# Patient Record
Sex: Female | Born: 1985 | Race: Black or African American | Hispanic: No | Marital: Single | Smoking: Current every day smoker
Health system: Southern US, Community
[De-identification: ages and names within clinical notes are randomized; demographics above are authoritative.]

## PROBLEM LIST (undated history)

## (undated) HISTORY — PX: BREAST REDUCTION SURGERY: SHX8

---

## 2014-01-25 ENCOUNTER — Encounter (HOSPITAL_COMMUNITY): Payer: Self-pay | Admitting: Emergency Medicine

## 2014-01-25 ENCOUNTER — Emergency Department (HOSPITAL_COMMUNITY)
Admission: EM | Admit: 2014-01-25 | Discharge: 2014-01-26 | Disposition: A | Discharge status: Home / Self Care | Payer: Non-veteran care | Attending: Emergency Medicine | Admitting: Emergency Medicine

## 2014-01-25 DIAGNOSIS — F17200 Nicotine dependence, unspecified, uncomplicated: Secondary | ICD-10-CM | POA: Insufficient documentation

## 2014-01-25 DIAGNOSIS — J329 Chronic sinusitis, unspecified: Secondary | ICD-10-CM | POA: Insufficient documentation

## 2014-01-25 MED ORDER — PSEUDOEPHEDRINE HCL 60 MG PO TABS: 60.0000 mg | ORAL_TABLET | ORAL | Status: AC | PRN

## 2014-01-25 NOTE — ED Provider Notes (Signed)
CSN: [id number]     Arrival date & time 01/25/14  2223 History   This chart was scribed for non-physician practitioner, Junious Silk, PA-C, working with Dione Booze, MD by Smiley Houseman, ED Scribe. This patient was seen in room TR07C/TR07C and the patient's care was started at 11:33 PM.  No chief complaint on file.  The history is provided by the patient. No language interpreter was used.   HPI Comments: Lori Dunn is a 28 y.o. female who presents to the Emergency Department complaining of a constant worsening sore throat that started 4 days ago.  Pt has associated HA, chest congestion, and non productive cough.  Pt denies fever, chills, abdominal pain, nausea, and emesis.  Pt denies taking any OTC medications.  Pt states she is otherwise healthy.    History reviewed. No pertinent past medical history. History reviewed. No pertinent past surgical history. No family history on file. History  Substance Use Topics  . Smoking status: Current Every Day Smoker  . Smokeless tobacco: Not on file  . Alcohol Use: Yes     Comment: occ   OB History   Grav Para Term Preterm Abortions TAB SAB Ect Mult Living                 Review of Systems  Constitutional: Negative for fever and chills.  HENT: Positive for congestion and sore throat. Negative for ear pain and rhinorrhea.   Respiratory: Positive for cough. Negative for shortness of breath.   Cardiovascular: Negative for chest pain.  Gastrointestinal: Negative for nausea, vomiting, abdominal pain and diarrhea.  Musculoskeletal: Negative for back pain and myalgias.  Skin: Negative for color change and rash.  Neurological: Positive for headaches. Negative for syncope.  All other systems reviewed and are negative.   Allergies  Ibuprofen and Other  Home Medications   Triage Vitals: BP 124/61  Pulse 103  Temp(Src) 98.2 F (36.8 C) (Oral)  Resp 18  Wt 165 lb 8 oz (75.07 kg)  SpO2 99%  LMP 01/21/2014  Physical Exam  Nursing note  and vitals reviewed. Constitutional: She is oriented to person, place, and time. She appears well-developed and well-nourished. No distress.  HENT:  Head: Normocephalic and atraumatic.  Right Ear: Tympanic membrane, external ear and ear canal normal.  Left Ear: Tympanic membrane, external ear and ear canal normal.  Nose: Right sinus exhibits maxillary sinus tenderness and frontal sinus tenderness. Left sinus exhibits maxillary sinus tenderness and frontal sinus tenderness.  Mouth/Throat: Uvula is midline and mucous membranes are normal. No trismus in the jaw. No uvula swelling. Posterior oropharyngeal erythema (mild) present. No oropharyngeal exudate, posterior oropharyngeal edema or tonsillar abscesses.  Eyes: Conjunctivae are normal.  Neck: Normal range of motion. No thyromegaly present.  Cardiovascular: Normal rate, regular rhythm and normal heart sounds.   Pulmonary/Chest: Effort normal and breath sounds normal. No stridor. No respiratory distress. She has no wheezes. She has no rales.  Abdominal: Soft. She exhibits no distension.  Musculoskeletal: Normal range of motion.  Lymphadenopathy:    She has no cervical adenopathy.  Neurological: She is alert and oriented to person, place, and time. She has normal strength.  Skin: Skin is warm and dry. No rash noted. She is not diaphoretic. No erythema.  Psychiatric: She has a normal mood and affect. Her behavior is normal. Judgment and thought content normal.    ED Course  Procedures (including critical care time) DIAGNOSTIC STUDIES: Oxygen Saturation is 99% on RA, normal by my interpretation.  COORDINATION OF CARE: 11:38 PM-Will order rapid strep screen.  Patient informed of current plan of treatment and evaluation and agrees with plan.    Results for orders placed during the hospital encounter of 01/25/14  RAPID STREP SCREEN      Result Value Ref Range   Streptococcus, Group A Screen (Direct) NEGATIVE  NEGATIVE    Imaging  Review No results found.   EKG Interpretation None      MDM   Final diagnoses:  Sinusitis   Patient complaining of symptoms of sinusitis. Mild to moderate symptoms of clear/yellow nasal discharge/congestion and scratchy throat with cough for less than 10 days.  Patient is afebrile.  No concern for acute bacterial rhinosinusitis; likely viral in nature.  Patient discharged with symptomatic treatment. Recommendations for follow-up with primary care physician.    I personally performed the services described in this documentation, which was scribed in my presence. The recorded information has been reviewed and is accurate.      Mora BellmanHannah S Lajuan Godbee, PA-C 01/26/14 [phone]

## 2014-01-25 NOTE — Discharge Instructions (Signed)

## 2014-01-25 NOTE — ED Notes (Signed)
PT sore throat, headache since Saturday.  Throat red and irritated.  Pt sounds congested, sinus area sore

## 2014-01-25 NOTE — ED Notes (Signed)
Pt reports sore throat since Saturday

## 2014-01-26 LAB — RAPID STREP SCREEN (MED CTR MEBANE ONLY): Streptococcus, Group A Screen (Direct): NEGATIVE

## 2014-01-27 NOTE — ED Provider Notes (Signed)
Medical screening examination/treatment/procedure(s) were performed by non-physician practitioner and as supervising physician I was immediately available for consultation/collaboration.   Dione Boozeavid Sorrel Cassetta, MD 01/27/14 [phone]

## 2014-01-28 LAB — CULTURE, GROUP A STREP

## 2016-09-09 ENCOUNTER — Emergency Department (HOSPITAL_COMMUNITY)
Admission: EM | Admit: 2016-09-09 | Discharge: 2016-09-09 | Disposition: A | Discharge status: Home / Self Care | Payer: Non-veteran care | Attending: Emergency Medicine | Admitting: Emergency Medicine

## 2016-09-09 ENCOUNTER — Emergency Department (HOSPITAL_COMMUNITY): Payer: Non-veteran care

## 2016-09-09 ENCOUNTER — Encounter (HOSPITAL_COMMUNITY): Payer: Self-pay

## 2016-09-09 DIAGNOSIS — Y939 Activity, unspecified: Secondary | ICD-10-CM | POA: Diagnosis not present

## 2016-09-09 DIAGNOSIS — Y999 Unspecified external cause status: Secondary | ICD-10-CM | POA: Insufficient documentation

## 2016-09-09 DIAGNOSIS — F17210 Nicotine dependence, cigarettes, uncomplicated: Secondary | ICD-10-CM | POA: Diagnosis not present

## 2016-09-09 DIAGNOSIS — M25552 Pain in left hip: Secondary | ICD-10-CM | POA: Insufficient documentation

## 2016-09-09 DIAGNOSIS — S161XXA Strain of muscle, fascia and tendon at neck level, initial encounter: Secondary | ICD-10-CM | POA: Diagnosis not present

## 2016-09-09 DIAGNOSIS — S0990XA Unspecified injury of head, initial encounter: Secondary | ICD-10-CM | POA: Insufficient documentation

## 2016-09-09 DIAGNOSIS — Y92410 Unspecified street and highway as the place of occurrence of the external cause: Secondary | ICD-10-CM | POA: Diagnosis not present

## 2016-09-09 LAB — I-STAT BETA HCG BLOOD, ED (MC, WL, AP ONLY)

## 2016-09-09 MED ORDER — HYDROCODONE-ACETAMINOPHEN 5-325 MG PO TABS: 1.0000 | ORAL_TABLET | Freq: Four times a day (QID) | ORAL | 0 refills | Status: AC | PRN

## 2016-09-09 MED ORDER — CYCLOBENZAPRINE HCL 10 MG PO TABS: 10.0000 mg | ORAL_TABLET | Freq: Two times a day (BID) | ORAL | 0 refills | Status: AC | PRN

## 2016-09-09 MED ORDER — HYDROCODONE-ACETAMINOPHEN 5-325 MG PO TABS
2.0000 | ORAL_TABLET | Freq: Once | ORAL | Status: AC
  Administered 2016-09-09: 2 via ORAL
  Filled 2016-09-09: qty 2
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
## 2016-09-09 NOTE — ED Notes (Signed)
Pt to be moved to adult side to be seen.
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
## 2017-10-27 ENCOUNTER — Emergency Department (HOSPITAL_COMMUNITY)
Admission: EM | Admit: 2017-10-27 | Discharge: 2017-10-27 | Disposition: A | Discharge status: Home / Self Care | Payer: Non-veteran care | Attending: Emergency Medicine | Admitting: Emergency Medicine

## 2017-10-27 ENCOUNTER — Encounter (HOSPITAL_COMMUNITY): Payer: Self-pay

## 2017-10-27 ENCOUNTER — Other Ambulatory Visit: Payer: Self-pay

## 2017-10-27 ENCOUNTER — Emergency Department (HOSPITAL_COMMUNITY): Payer: Non-veteran care

## 2017-10-27 DIAGNOSIS — J189 Pneumonia, unspecified organism: Secondary | ICD-10-CM

## 2017-10-27 DIAGNOSIS — R072 Precordial pain: Secondary | ICD-10-CM | POA: Diagnosis present

## 2017-10-27 DIAGNOSIS — F17210 Nicotine dependence, cigarettes, uncomplicated: Secondary | ICD-10-CM | POA: Diagnosis not present

## 2017-10-27 DIAGNOSIS — Z720 Tobacco use: Secondary | ICD-10-CM

## 2017-10-27 DIAGNOSIS — J181 Lobar pneumonia, unspecified organism: Secondary | ICD-10-CM | POA: Diagnosis not present

## 2017-10-27 LAB — BASIC METABOLIC PANEL
Anion gap: 11 (ref 5–15)
BUN: 8 mg/dL (ref 6–20)
CALCIUM: 9 mg/dL (ref 8.9–10.3)
CO2: 19 mmol/L — ABNORMAL LOW (ref 22–32)
Chloride: 103 mmol/L (ref 101–111)
Creatinine, Ser: 0.97 mg/dL (ref 0.44–1.00)
GFR calc Af Amer: >60 mL/min (ref >60)
GFR calc non Af Amer: >60 mL/min (ref >60)
Glucose, Bld: 111 mg/dL — ABNORMAL HIGH (ref 65–99)
POTASSIUM: 3.5 mmol/L (ref 3.5–5.1)
SODIUM: 133 mmol/L — AB (ref 135–145)

## 2017-10-27 LAB — CBC
HCT: 42.6 % (ref 36.0–46.0)
Hemoglobin: 13.7 g/dL (ref 12.0–15.0)
MCH: 29.8 pg (ref 26.0–34.0)
MCHC: 32.2 g/dL (ref 30.0–36.0)
MCV: 92.6 fL (ref 78.0–100.0)
PLATELETS: 397 10*3/uL (ref 150–400)
RBC: 4.6 MIL/uL (ref 3.87–5.11)
RDW: 13.8 % (ref 11.5–15.5)
WBC: 19 10*3/uL — AB (ref 4.0–10.5)

## 2017-10-27 LAB — I-STAT TROPONIN, ED
TROPONIN I, POC: 0.03 ng/mL (ref 0.00–0.08)
Troponin i, poc: 0 ng/mL (ref 0.00–0.08)

## 2017-10-27 LAB — I-STAT BETA HCG BLOOD, ED (MC, WL, AP ONLY): I-stat hCG, quantitative: 27.7 m[IU]/mL — ABNORMAL HIGH (ref <5)

## 2017-10-27 MED ORDER — GI COCKTAIL ~~LOC~~
30.0000 mL | Freq: Once | ORAL | Status: AC
  Administered 2017-10-27: 30 mL via ORAL
  Filled 2017-10-27: qty 30

## 2017-10-27 MED ORDER — ASPIRIN 81 MG PO CHEW
324.0000 mg | CHEWABLE_TABLET | Freq: Once | ORAL | Status: AC
Start: 2017-10-27 — End: 2017-10-27
  Administered 2017-10-27: 324 mg via ORAL
  Filled 2017-10-27: qty 4

## 2017-10-27 MED ORDER — ALBUTEROL SULFATE (2.5 MG/3ML) 0.083% IN NEBU
5.0000 mg | INHALATION_SOLUTION | Freq: Once | RESPIRATORY_TRACT | Status: AC
  Administered 2017-10-27: 5 mg via RESPIRATORY_TRACT
  Filled 2017-10-27: qty 6

## 2017-10-27 MED ORDER — IPRATROPIUM BROMIDE 0.02 % IN SOLN
0.5000 mg | Freq: Once | RESPIRATORY_TRACT | Status: AC
Start: 2017-10-27 — End: 2017-10-27
  Administered 2017-10-27: 0.5 mg via RESPIRATORY_TRACT
  Filled 2017-10-27: qty 2.5

## 2017-10-27 MED ORDER — MORPHINE SULFATE (PF) 4 MG/ML IV SOLN
4.0000 mg | Freq: Once | INTRAVENOUS | Status: AC
Start: 2017-10-27 — End: 2017-10-27
  Administered 2017-10-27: 4 mg via INTRAVENOUS
  Filled 2017-10-27: qty 1

## 2017-10-27 MED ORDER — ALBUTEROL SULFATE HFA 108 (90 BASE) MCG/ACT IN AERS
2.0000 | INHALATION_SPRAY | Freq: Once | RESPIRATORY_TRACT | Status: AC
  Administered 2017-10-27: 2 via RESPIRATORY_TRACT
  Filled 2017-10-27: qty 6.7

## 2017-10-27 MED ORDER — IPRATROPIUM BROMIDE 0.02 % IN SOLN
0.5000 mg | Freq: Once | RESPIRATORY_TRACT | Status: AC
  Administered 2017-10-27: 0.5 mg via RESPIRATORY_TRACT
  Filled 2017-10-27: qty 2.5

## 2017-10-27 MED ORDER — ALBUTEROL SULFATE HFA 108 (90 BASE) MCG/ACT IN AERS: 1.0000 | INHALATION_SPRAY | RESPIRATORY_TRACT | 0 refills | Status: AC | PRN

## 2017-10-27 MED ORDER — DOXYCYCLINE HYCLATE 100 MG PO CAPS: 100.0000 mg | ORAL_CAPSULE | Freq: Two times a day (BID) | ORAL | 0 refills | Status: AC

## 2017-10-27 NOTE — Discharge Instructions (Signed)
Your work up today has revealed that you have pneumonia in your left lower lobe, take antibiotics as directed until completed. Continue to stay well-hydrated. Continue to alternate between Tylenol and Ibuprofen for pain or fever. Use Mucinex for cough suppression/expectoration of mucus. May consider over-the-counter Benadryl or other antihistamine to decrease secretions and for help with your symptoms. Use inhaler as directed, as needed for cough/chest congestion/wheezing/shortness of breath. STOP SMOKING! Follow up with your primary care doctor in 5-7 days for recheck of ongoing symptoms. Return to emergency department for emergent changing or worsening of symptoms.

## 2017-10-27 NOTE — ED Provider Notes (Signed)
[facility] Provider Note   CSN: [id number] Arrival date & time: 10/27/17  1024     History   Chief Complaint Chief Complaint  Patient presents with  . Chest Pain    HPI Lori Dunn is a 32 y.o. otherwise healthy female, who presents to the ED with complaints of gradual onset chest pain that began around 8:30 PM while at rest yesterday.  She describes the pain as 9/10 constant stabbing central chest pain radiating into her left neck and shoulder, worse with breathing, and unrelieved with 2 baby aspirins.  She states that she was sweating all night, had associated lightheadedness, tingling in her left arm, and slept in her chair because laying flat made the pain worse however she denies gasping for breath or having difficulty breathing if laying flat.  She admits to being a cigarette smoker, but denies having any history of COPD or asthma.  No known family history of cardiac disease.  She has never had a stress test or an echo, however she is supposed to have a stress test through the [facility].  [place] P[place] at the [facility] in [place].  Of note, LMP was 10/17/17 until several days ago, and she is not sexually active with males, denies any possibility of pregnancy.  She denies fevers, chills, cough, URI symptoms, SOB, LE swelling, recent travel/surgery/immobilization, estrogen use, personal/family hx of DVT/PE, abd pain, N/V/D/C, hematuria, dysuria, myalgias, arthralgias, claudication, unexpected weight change, numbness, focal weakness, or any other complaints at this time.    The history is provided by the patient and medical records. No language interpreter was used.  Chest Pain   This is a new problem. The current episode started 12 to 24 hours ago. The problem occurs constantly. The problem has not changed since onset.The pain is associated with rest. The pain is present in the substernal region. The pain is at a severity of 9/10. The pain is moderate. The  quality of the pain is described as stabbing. The pain radiates to the left neck and left shoulder. Duration of episode(s) is 15 hours. The symptoms are aggravated by deep breathing. Associated symptoms include diaphoresis (sweat all last night) and orthopnea (not gasping for air, but pain worsened if laying flat). Pertinent negatives include no abdominal pain, no claudication, no cough, no fever, no lower extremity edema, no nausea, no numbness, no shortness of breath, no vomiting and no weakness. Treatments tried: 2 ASA 81mg . The treatment provided no relief. Risk factors include smoking/tobacco exposure.  Pertinent negatives for past medical history include no COPD, no DVT and no PE.  Pertinent negatives for family medical history include: no CAD, no early MI and no PE.    History reviewed. No pertinent past medical history.  There are no active problems to display for this patient.   Past Surgical History:  Procedure Laterality Date  . BREAST REDUCTION SURGERY      OB History    No data available       Home Medications    Prior to Admission medications   Medication Sig Start Date End Date Taking? Authorizing Provider  cyclobenzaprine (FLEXERIL) 10 MG tablet Take 1 tablet (10 mg total) by mouth 2 (two) times daily as needed for muscle spasms. 09/09/16   Roxy HorsemanBrowning, Robert, PA-C  HYDROcodone-acetaminophen (NORCO/VICODIN) 5-325 MG tablet Take 1-2 tablets by mouth every 6 (six) hours as needed. 09/09/16   Roxy HorsemanBrowning, Robert, PA-C  pseudoephedrine (SUDAFED) 60 MG tablet Take 1 tablet (60 mg  total) by mouth every 4 (four) hours as needed for congestion. Patient not taking: Reported on 09/09/2016 01/25/14   Junious Silk, PA-C    Family History No family history on file.  Social History Social History   Tobacco Use  . Smoking status: Current Every Day Smoker    Packs/day: 0.50    Types: Cigarettes  . Smokeless tobacco: Never Used  Substance Use Topics  . Alcohol use: Yes     Comment: occ  . Drug use: No     Allergies   Ibuprofen and Other   Review of Systems Review of Systems  Constitutional: Positive for diaphoresis (sweat all last night). Negative for chills, fever and unexpected weight change.  HENT: Negative for congestion and rhinorrhea.   Respiratory: Negative for cough and shortness of breath.   Cardiovascular: Positive for chest pain and orthopnea (not gasping for air, but pain worsened if laying flat). Negative for claudication and leg swelling.  Gastrointestinal: Negative for abdominal pain, constipation, diarrhea, nausea and vomiting.  Genitourinary: Negative for dysuria and hematuria.  Musculoskeletal: Negative for arthralgias and myalgias.  Skin: Negative for color change.  Allergic/Immunologic: Negative for immunocompromised state.  Neurological: Positive for light-headedness. Negative for weakness and numbness.       +tingling L arm  Psychiatric/Behavioral: Negative for confusion.   All other systems reviewed and are negative for acute change except as noted in the HPI.    Physical Exam Updated Vital Signs BP (!) 137/58 (BP Location: Right Arm)   Pulse 98   Temp 98.2 F (36.8 C) (Oral)   Resp 20   Ht 5\' 4"  (1.626 m)   Wt 81.6 kg (180 lb)   LMP 10/19/2017   SpO2 99%   BMI 30.90 kg/m   Physical Exam  Constitutional: She is oriented to person, place, and time. Vital signs are normal. She appears well-developed and well-nourished.  Non-toxic appearance. No distress.  Afebrile, nontoxic, NAD  HENT:  Head: Normocephalic and atraumatic.  Mouth/Throat: Oropharynx is clear and moist and mucous membranes are normal.  Eyes: Conjunctivae and EOM are normal. Right eye exhibits no discharge. Left eye exhibits no discharge.  Neck: Normal range of motion. Neck supple.  Cardiovascular: Normal rate, regular rhythm, normal heart sounds and intact distal pulses. Exam reveals no gallop and no friction rub.  No murmur heard. RRR, nl s1/s2, no  m/r/g, distal pulses intact, no pedal edema   Pulmonary/Chest: Effort normal. No respiratory distress. She has decreased breath sounds in the left lower field. She has no wheezes. She has rhonchi in the left lower field. She has no rales. She exhibits tenderness. She exhibits no crepitus, no deformity and no retraction.  Mildly diminished lung sounds in LLL with slight mild rhonchi in that area, otherwise remainder of lungs CTAB in all other lung fields, no wheezing/rales, no hypoxia or increased WOB, speaking in full sentences, SpO2 99% on RA Chest wall with mild TTP over anterior sternum, without crepitus, deformities, or retractions   Abdominal: Soft. Normal appearance and bowel sounds are normal. She exhibits no distension. There is no tenderness. There is no rigidity, no rebound, no guarding, no CVA tenderness, no tenderness at McBurney's point and negative Murphy's sign.  Musculoskeletal: Normal range of motion.  MAE x4 Strength and sensation grossly intact in all extremities Distal pulses intact No pedal edema, neg homan's bilaterally   Neurological: She is alert and oriented to person, place, and time. She has normal strength. No sensory deficit.  Skin: Skin is  warm, dry and intact. No rash noted.  Psychiatric: She has a normal mood and affect.  Nursing note and vitals reviewed.    ED Treatments / Results  Labs (all labs ordered are listed, but only abnormal results are displayed) Labs Reviewed  BASIC METABOLIC PANEL - Abnormal; Notable for the following components:      Result Value   Sodium 133 (*)    CO2 19 (*)    Glucose, Bld 111 (*)    All other components within normal limits  CBC - Abnormal; Notable for the following components:   WBC 19.0 (*)    All other components within normal limits  I-STAT BETA HCG BLOOD, ED ([facility], [facility], [facility] ONLY) - Abnormal; Notable for the following components:   I-stat hCG, quantitative 27.7 (*)    All other components within normal limits  I-STAT  TROPONIN, ED  I-STAT TROPONIN, ED    EKG  EKG Interpretation  Date/Time:  Monday October 27 2017 10:28:54 EST Ventricular Rate:  98 PR Interval:  160 QRS Duration: 76 QT Interval:  314 QTC Calculation: 400 R Axis:   87 Text Interpretation:  Sinus rhythm with marked sinus arrhythmia T wave abnormality, consider anterior ischemia Abnormal ECG No STEMI.  Confirmed by Alona Bene [phone] on 10/27/2017 11:52:36 AM       Radiology Dg Chest 2 View  Result Date: 10/27/2017 CLINICAL DATA:  Chest pain. EXAM: CHEST  2 VIEW COMPARISON:  None. FINDINGS: The cardiomediastinal silhouette is normal in size. Normal pulmonary vascularity. New opacity in the anterior aspect of the left lower lobe. No pleural effusion or pneumothorax. No acute osseous abnormality. IMPRESSION: Patchy infiltrate in the left lower lobe, concerning for bronchopneumonia. Electronically Signed   By: Obie Dredge M.D.   On: 10/27/2017 11:16    Procedures Procedures (including critical care time)  Medications Ordered in ED Medications  albuterol (PROVENTIL HFA;VENTOLIN HFA) 108 (90 Base) MCG/ACT inhaler 2 puff (not administered)  morphine 4 MG/ML injection 4 mg (4 mg Intravenous Given 10/27/17 1222)  aspirin chewable tablet 324 mg (324 mg Oral Given 10/27/17 1222)  gi cocktail (Maalox,Lidocaine,Donnatal) (30 mLs Oral Given 10/27/17 1222)  albuterol (PROVENTIL) (2.5 MG/3ML) 0.083% nebulizer solution 5 mg (5 mg Nebulization Given 10/27/17 1222)  ipratropium (ATROVENT) nebulizer solution 0.5 mg (0.5 mg Nebulization Given 10/27/17 1222)  albuterol (PROVENTIL) (2.5 MG/3ML) 0.083% nebulizer solution 5 mg (5 mg Nebulization Given 10/27/17 1412)  ipratropium (ATROVENT) nebulizer solution 0.5 mg (0.5 mg Nebulization Given 10/27/17 1413)     Initial Impression / Assessment and Plan / ED Course  I have reviewed the triage vital signs and the nursing notes.  Pertinent labs & imaging results that were available during my care of the patient  were reviewed by me and considered in my medical decision making (see chart for details).     32 y.o. female here with CP x15hrs, states she sweat all last night, slept in a chair because it was more comfortable, and has mild lightheadedness and tingling in L arm. On exam, mild rhonchi/diminished lung sounds in LLL, otherwise clear lungs in remainder of lung fields, mild chest wall tenderness, no pedal edema, no tachycardia or hypoxia. EKG with TWI in multiple leads, no prior to compare to. CXR done in triage showing patchy infiltrate in LLL concerning for PNA, which would match her lung exam, however pt denies any cough recently. Trop neg, BetaHCG falsely elevated at 27, doubt actual pregnancy as pt is a lesbian and just finished her  menses. Doubt PE. Will await remainder of work up, give pain meds and duoneb, then reassess shortly.   1:11 PM CBC with leukocytosis of 19.0. BMP essentially unremarkable. Pt's lung sounds slightly improved after duoneb, less diminished, no wheezing/rhonchi. Pt feeling better after meds. Will repeat troponin at 3hr mark from first one, but overall her symptoms/work up matches having PNA despite not having a cough. If second trop neg, will likely d/c home with inhaler and tx for CAP. Will reassess after second troponin. Discussed case with my attending Dr. Jacqulyn Bath who agrees with plan.   3:10 PM Second trop 0.00, which is lower than her first one at 0.03. Lung sounds greatly improved after second duoneb, no longer diminished at all, no rhonchi/rales. Pt feeling better, states pain improved. I suspect that she has PNA, despite denying cough, during repeat evaluation she is heard coughing; all of her other symptoms would make sense with this. Highly doubt PE or infarcted portion of lung. Given delta trop neg and reassuring work up, with low HEART score, doubt need for further work up or admission. Will send home with tx for CAP and inhaler. Advised OTC remedies for symptomatic  relief. Smoking cessation strongly encouraged. F/up with PCP in 1wk for recheck of symptoms and ongoing management. I explained the diagnosis and have given explicit precautions to return to the ER including for any other new or worsening symptoms. The patient understands and accepts the medical plan as it's been dictated and I have answered their questions. Discharge instructions concerning home care and prescriptions have been given. The patient is STABLE and is discharged to home in good condition.    Final Clinical Impressions(s) / ED Diagnoses   Final diagnoses:  Precordial chest pain  Community acquired pneumonia of left lower lobe of lung (HCC)  Tobacco user    ED Discharge Orders        Ordered    albuterol (PROVENTIL HFA;VENTOLIN HFA) 108 (90 Base) MCG/ACT inhaler  Every 4 hours PRN     10/27/17 1509    doxycycline (VIBRAMYCIN) 100 MG capsule  2 times daily     10/27/17 [address], [place], [place] 10/27/17 1510    Long, Arlyss Repress, MD 10/27/17 1946

## 2017-10-27 NOTE — ED Triage Notes (Signed)
Chest pain began last night, center of the chest into the lt. Chest /shoulder area.  Chest pain is sharp .  Pt. Reports movement and breathing makes the pain worse,  Nothing alleviated the pain.  Pt. Denies any n/v/d is having dizziness.  Skin is warm and dry.  Pt. Denies any cold symptoms.  ECG completed.   Pt. Had an episode of chest pain and is currently trying to set up a stress Test through the [facility].  Pain is 8/10.  Pt. Denies any physical injury.

## 2017-10-27 NOTE — ED Notes (Signed)
It was not documented that Pt had IV in RAC because I could not click it off that I had removed it.  IV was clean, dry and intact upon removal. Pt tolerated it well. IV was removed at 15:19 on 10/27/2017. Pt did not leave with IV intact.

## 2017-11-27 ENCOUNTER — Encounter (HOSPITAL_COMMUNITY): Payer: Self-pay | Admitting: Emergency Medicine

## 2017-11-27 ENCOUNTER — Emergency Department (HOSPITAL_COMMUNITY): Payer: Non-veteran care

## 2017-11-27 DIAGNOSIS — R0789 Other chest pain: Secondary | ICD-10-CM | POA: Insufficient documentation

## 2017-11-27 DIAGNOSIS — Z5321 Procedure and treatment not carried out due to patient leaving prior to being seen by health care provider: Secondary | ICD-10-CM | POA: Diagnosis not present

## 2017-11-27 DIAGNOSIS — R0602 Shortness of breath: Secondary | ICD-10-CM | POA: Insufficient documentation

## 2017-11-27 LAB — CBC
HCT: 42.1 % (ref 36.0–46.0)
HEMOGLOBIN: 13.9 g/dL (ref 12.0–15.0)
MCH: 30.8 pg (ref 26.0–34.0)
MCHC: 33 g/dL (ref 30.0–36.0)
MCV: 93.3 fL (ref 78.0–100.0)
Platelets: 357 10*3/uL (ref 150–400)
RBC: 4.51 MIL/uL (ref 3.87–5.11)
RDW: 14.3 % (ref 11.5–15.5)
WBC: 8.8 10*3/uL (ref 4.0–10.5)

## 2017-11-27 LAB — I-STAT BETA HCG BLOOD, ED (MC, WL, AP ONLY): I-stat hCG, quantitative: <5 m[IU]/mL (ref <5)

## 2017-11-27 LAB — BASIC METABOLIC PANEL
ANION GAP: 10 (ref 5–15)
BUN: 11 mg/dL (ref 6–20)
CO2: 21 mmol/L — AB (ref 22–32)
Calcium: 9 mg/dL (ref 8.9–10.3)
Chloride: 104 mmol/L (ref 101–111)
Creatinine, Ser: 1.07 mg/dL — ABNORMAL HIGH (ref 0.44–1.00)
GFR calc Af Amer: >60 mL/min (ref >60)
GFR calc non Af Amer: >60 mL/min (ref >60)
GLUCOSE: 98 mg/dL (ref 65–99)
POTASSIUM: 4.3 mmol/L (ref 3.5–5.1)
Sodium: 135 mmol/L (ref 135–145)

## 2017-11-27 LAB — I-STAT TROPONIN, ED: Troponin i, poc: 0 ng/mL (ref 0.00–0.08)

## 2017-11-27 NOTE — ED Triage Notes (Signed)
Pt c/o chest pain and shortness of breath x 1 month. Pt diagnosed with pneumonia in January, completed antibiotic and reports no change.

## 2017-11-28 ENCOUNTER — Encounter (HOSPITAL_COMMUNITY): Payer: Self-pay | Admitting: Nurse Practitioner

## 2017-11-28 ENCOUNTER — Emergency Department (HOSPITAL_COMMUNITY)
Admission: EM | Admit: 2017-11-28 | Discharge: 2017-11-28 | Disposition: A | Discharge status: Home / Self Care | Payer: Non-veteran care | Attending: Emergency Medicine | Admitting: Emergency Medicine

## 2017-11-28 DIAGNOSIS — F17210 Nicotine dependence, cigarettes, uncomplicated: Secondary | ICD-10-CM | POA: Diagnosis not present

## 2017-11-28 DIAGNOSIS — J069 Acute upper respiratory infection, unspecified: Secondary | ICD-10-CM

## 2017-11-28 DIAGNOSIS — R0789 Other chest pain: Secondary | ICD-10-CM

## 2017-11-28 DIAGNOSIS — R079 Chest pain, unspecified: Secondary | ICD-10-CM | POA: Diagnosis present

## 2017-11-28 LAB — CBC
HCT: 43.4 % (ref 36.0–46.0)
HEMOGLOBIN: 13.8 g/dL (ref 12.0–15.0)
MCH: 29.9 pg (ref 26.0–34.0)
MCHC: 31.8 g/dL (ref 30.0–36.0)
MCV: 93.9 fL (ref 78.0–100.0)
Platelets: 377 10*3/uL (ref 150–400)
RBC: 4.62 MIL/uL (ref 3.87–5.11)
RDW: 14.1 % (ref 11.5–15.5)
WBC: 9 10*3/uL (ref 4.0–10.5)

## 2017-11-28 LAB — D-DIMER, QUANTITATIVE (NOT AT ARMC): D DIMER QUANT: 0.39 ug{FEU}/mL (ref 0.00–0.50)

## 2017-11-28 LAB — BASIC METABOLIC PANEL
ANION GAP: 9 (ref 5–15)
BUN: 9 mg/dL (ref 6–20)
CHLORIDE: 105 mmol/L (ref 101–111)
CO2: 23 mmol/L (ref 22–32)
CREATININE: 1.04 mg/dL — AB (ref 0.44–1.00)
Calcium: 9.2 mg/dL (ref 8.9–10.3)
GFR calc Af Amer: >60 mL/min (ref >60)
GFR calc non Af Amer: >60 mL/min (ref >60)
GLUCOSE: 96 mg/dL (ref 65–99)
Potassium: 4.1 mmol/L (ref 3.5–5.1)
Sodium: 137 mmol/L (ref 135–145)

## 2017-11-28 LAB — I-STAT BETA HCG BLOOD, ED (MC, WL, AP ONLY): I-stat hCG, quantitative: <5 m[IU]/mL (ref <5)

## 2017-11-28 LAB — I-STAT TROPONIN, ED: Troponin i, poc: 0 ng/mL (ref 0.00–0.08)

## 2017-11-28 NOTE — ED Notes (Signed)
Pt called multiple times without an answer 

## 2017-11-28 NOTE — ED Triage Notes (Addendum)
Per EMS pt presents for dry cough, shob, chills and constipation. Pt was dx with pneumonia 1/7 completed abc 1/14 on 1/31 pain and shob returned patient lwbs from here last night but symptoms are persistent. Last BM 11/26/17 Lungs sounds clear throughout.   Discussed with Dr. Fredderick PhenixBelfi will reorder CBC and BMP- as these were done yesterday

## 2017-11-28 NOTE — ED Provider Notes (Signed)
[facility] Provider Note   CSN: [id number] Arrival date & time: 11/28/17  1120     History   Chief Complaint Chief Complaint  Patient presents with  . Shortness of Breath    HPI Lori Dunn is a 32 y.o. female.  HPI  32 year old female presents with chest pain.  Has been going on since last week and has been constant and worsening.  Feels sharp when she takes a deep breath or moves and then otherwise is uncomfortable like a squeezing.  She feels short of breath as well.  She has a mild cough and has been feeling a sore throat.  No fevers but she has had some sweats at night.  Sometimes when walking she will feel short of breath.  No leg swelling.  Felt somewhat similar a month ago and was diagnosed with pneumonia.  She has the antibiotics and her symptoms improved.  Came back to the ER last night for reevaluation and had labs and a chest x-ray and ECG but than left due to the wait.  Went to the [facility] this morning for her appointment and they did an ECG and sent her back here.  However she does not know why otherwise.  The chest pain does not radiate but is across her entire anterior chest.  No vomiting.  History reviewed. No pertinent past medical history.  There are no active problems to display for this patient.   Past Surgical History:  Procedure Laterality Date  . BREAST REDUCTION SURGERY      OB History    No data available       Home Medications    Prior to Admission medications   Medication Sig Start Date End Date Taking? Authorizing Provider  albuterol (PROVENTIL HFA;VENTOLIN HFA) 108 (90 Base) MCG/ACT inhaler Inhale 1-2 puffs into the lungs every 4 (four) hours as needed for wheezing or shortness of breath (or cough). 10/27/17   Street, SeeleyMercedes, PA-C  cyclobenzaprine (FLEXERIL) 10 MG tablet Take 1 tablet (10 mg total) by mouth 2 (two) times daily as needed for muscle spasms. 09/09/16   Roxy HorsemanBrowning, Robert, PA-C  doxycycline  (VIBRAMYCIN) 100 MG capsule Take 1 capsule (100 mg total) by mouth 2 (two) times daily. One po bid x 7 days 10/27/17   Street, AlexanderMercedes, [place]-C  HYDROcodone-acetaminophen (NORCO/VICODIN) 5-325 MG tablet Take 1-2 tablets by mouth every 6 (six) hours as needed. 09/09/16   Roxy HorsemanBrowning, Robert, PA-C  pseudoephedrine (SUDAFED) 60 MG tablet Take 1 tablet (60 mg total) by mouth every 4 (four) hours as needed for congestion. 01/25/14   Junious SilkMerrell, Hannah, PA-C    Family History No family history on file.  Social History Social History   Tobacco Use  . Smoking status: Current Every Day Smoker    Packs/day: 0.50    Types: Cigarettes  . Smokeless tobacco: Never Used  Substance Use Topics  . Alcohol use: Yes    Comment: occ  . Drug use: No     Allergies   Ibuprofen and Other   Review of Systems Review of Systems  Constitutional: Positive for diaphoresis. Negative for fever.  HENT: Positive for sore throat.   Respiratory: Positive for cough and shortness of breath.   Cardiovascular: Positive for chest pain. Negative for leg swelling.  Gastrointestinal: Negative for abdominal pain and vomiting.     Physical Exam Updated Vital Signs BP 122/67 (BP Location: Right Arm)   Pulse 69   Temp 97.6 F (36.4 C) (Oral)  Resp 16   Ht 5\' 4"  (1.626 m)   Wt 83.9 kg (185 lb)   LMP 11/20/2017   SpO2 98%   BMI 31.76 kg/m   Physical Exam  Constitutional: She is oriented to person, place, and time. She appears well-developed and well-nourished.  Non-toxic appearance. She does not appear ill. No distress.  HENT:  Head: Normocephalic and atraumatic.  Right Ear: External ear normal.  Left Ear: External ear normal.  Nose: Nose normal.  Eyes: Right eye exhibits no discharge. Left eye exhibits no discharge.  Cardiovascular: Normal rate, regular rhythm and normal heart sounds.  Pulmonary/Chest: Effort normal and breath sounds normal. She exhibits tenderness (diffuse anterior chest wall tenderness).    Abdominal: Soft. There is no tenderness.  Neurological: She is alert and oriented to person, place, and time.  Skin: Skin is warm and dry. She is not diaphoretic.  Nursing note and vitals reviewed.    ED Treatments / Results  Labs (all labs ordered are listed, but only abnormal results are displayed) Labs Reviewed  BASIC METABOLIC PANEL - Abnormal; Notable for the following components:      Result Value   Creatinine, Ser 1.04 (*)    All other components within normal limits  CBC  D-DIMER, QUANTITATIVE (NOT AT [facility])  I-STAT TROPONIN, ED  I-STAT BETA HCG BLOOD, ED ([facility], [facility], [facility] ONLY)  I-STAT TROPONIN, ED    EKG  EKG Interpretation  Date/Time:  Friday November 28 2017 11:26:21 EST Ventricular Rate:  66 PR Interval:  172 QRS Duration: 88 QT Interval:  404 QTC Calculation: 423 R Axis:   96 Text Interpretation:  Normal sinus rhythm Rightward axis Septal infarct , age undetermined T wave abnormality, consider anterolateral ischemia Abnormal ECG T wave changes similar to yesterday and Jan 2019 Confirmed by Pricilla Loveless [phone]) on 11/28/2017 12:06:18 PM       Radiology Dg Chest 2 View  Result Date: 11/27/2017 CLINICAL DATA:  Pneumonia follow-up.  Chest pain. EXAM: CHEST  2 VIEW COMPARISON:  Chest radiograph 10/27/2017 FINDINGS: Near complete resolution of left lower lobe opacities. The lungs are otherwise clear. No pleural effusion or pneumothorax. Normal cardiomediastinal contours. IMPRESSION: Near complete resolution of left lower lobe opacities. Otherwise clear lungs. Electronically Signed   By: Deatra Robinson M.D.   On: 11/27/2017 21:25    Procedures Procedures (including critical care time)    EMERGENCY DEPARTMENT [place] CARDIAC EXAM "Study: Limited Ultrasound of the Heart and Pericardium"  INDICATIONS:Chest pain Multiple views of the heart and pericardium were obtained in real-time with a multi-frequency probe.  PERFORMED UE:AVWUJW IMAGES ARCHIVED?: Yes LIMITATIONS:   Body habitus VIEWS USED: Subcostal 4 chamber INTERPRETATION: Cardiac activity present, Pericardial effusioin absent and Cardiac tamponade absent  Medications Ordered in ED Medications - No data to display   Initial Impression / Assessment and Plan / ED Course  I have reviewed the triage vital signs and the nursing notes.  Pertinent labs & imaging results that were available during my care of the patient were reviewed by me and considered in my medical decision making (see chart for details).     Patient's chest pain is likely chest wall in etiology.  Possibly infectious versus inflammatory.  Unfortunately she cannot take ibuprofen as she is allergic.  This have instructed her to use Tylenol.  Given the pleuritic nature and some exertional shortness of breath a d-dimer was sent but is negative.  My suspicion for PE in this otherwise low risk patient with no risk factors  is low.  I do not think further workup needed.  Given recent infection, ultrasound performed which shows no obvious pericardial effusion as pericarditis is also a possible concern.  ECG is not normal but unchanged over the last 1 month.  My suspicion for ACS is low and I think she is stable for an outpatient workup at the [facility] were they are her primary physician.  She appears stable for discharge home with return precautions.  Final Clinical Impressions(s) / ED Diagnoses   Final diagnoses:  Chest wall pain  Upper respiratory tract infection, unspecified type    ED Discharge Orders    None       Pricilla Loveless, MD 11/28/17 2055

## 2017-11-28 NOTE — ED Notes (Signed)
Called pt to be reassessed 2X with no response.

## 2017-11-28 NOTE — ED Notes (Signed)
Called again with no answer in lobby 

## 2017-11-28 NOTE — ED Notes (Signed)
Called pt to be taken back. With no response.

## 2018-09-15 IMAGING — DX DG CHEST 2V
2 series · 2 of 2 positions shown · non-contrast
Comparison: Chest radiograph 10/27/2017

CLINICAL DATA: Pneumonia follow-up.  Chest pain.

EXAM:
CHEST  2 VIEW

[chest pa]
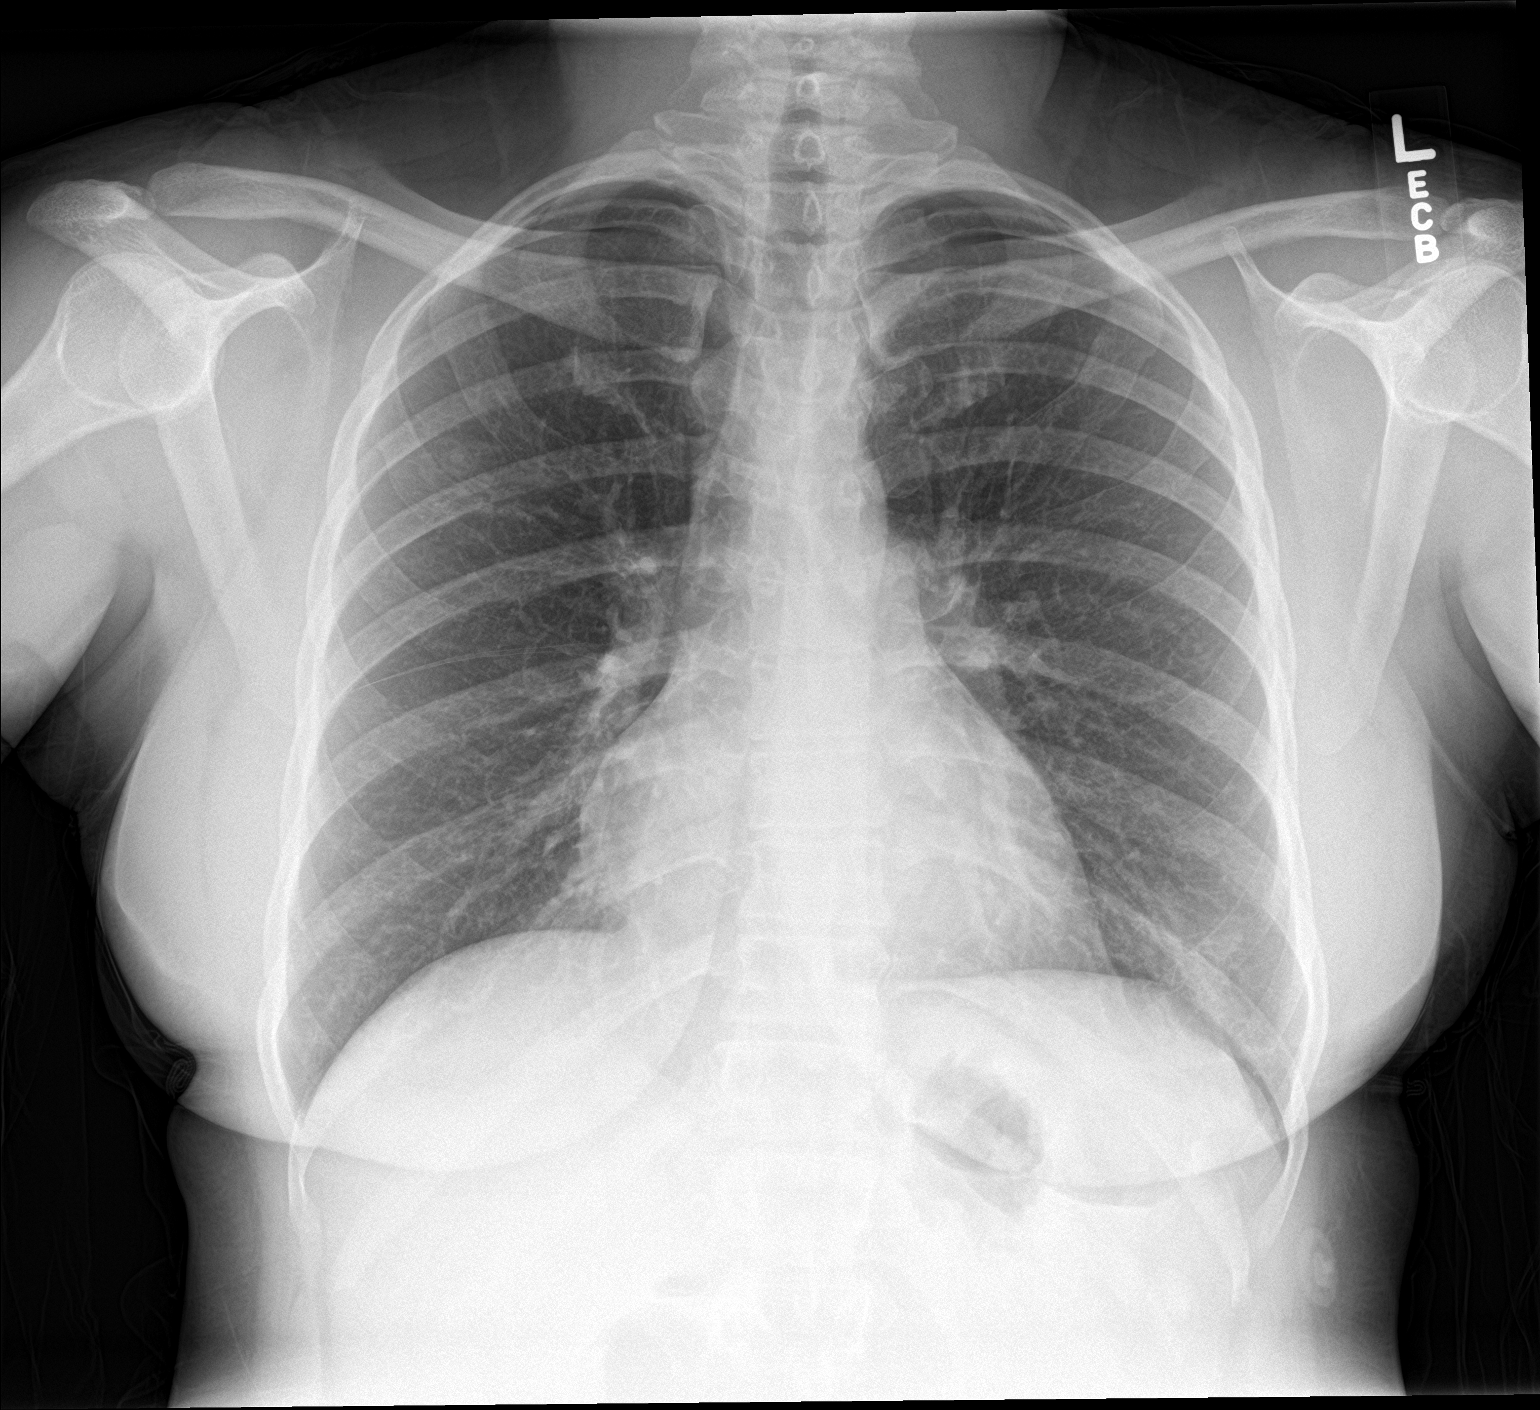

[chest lat]
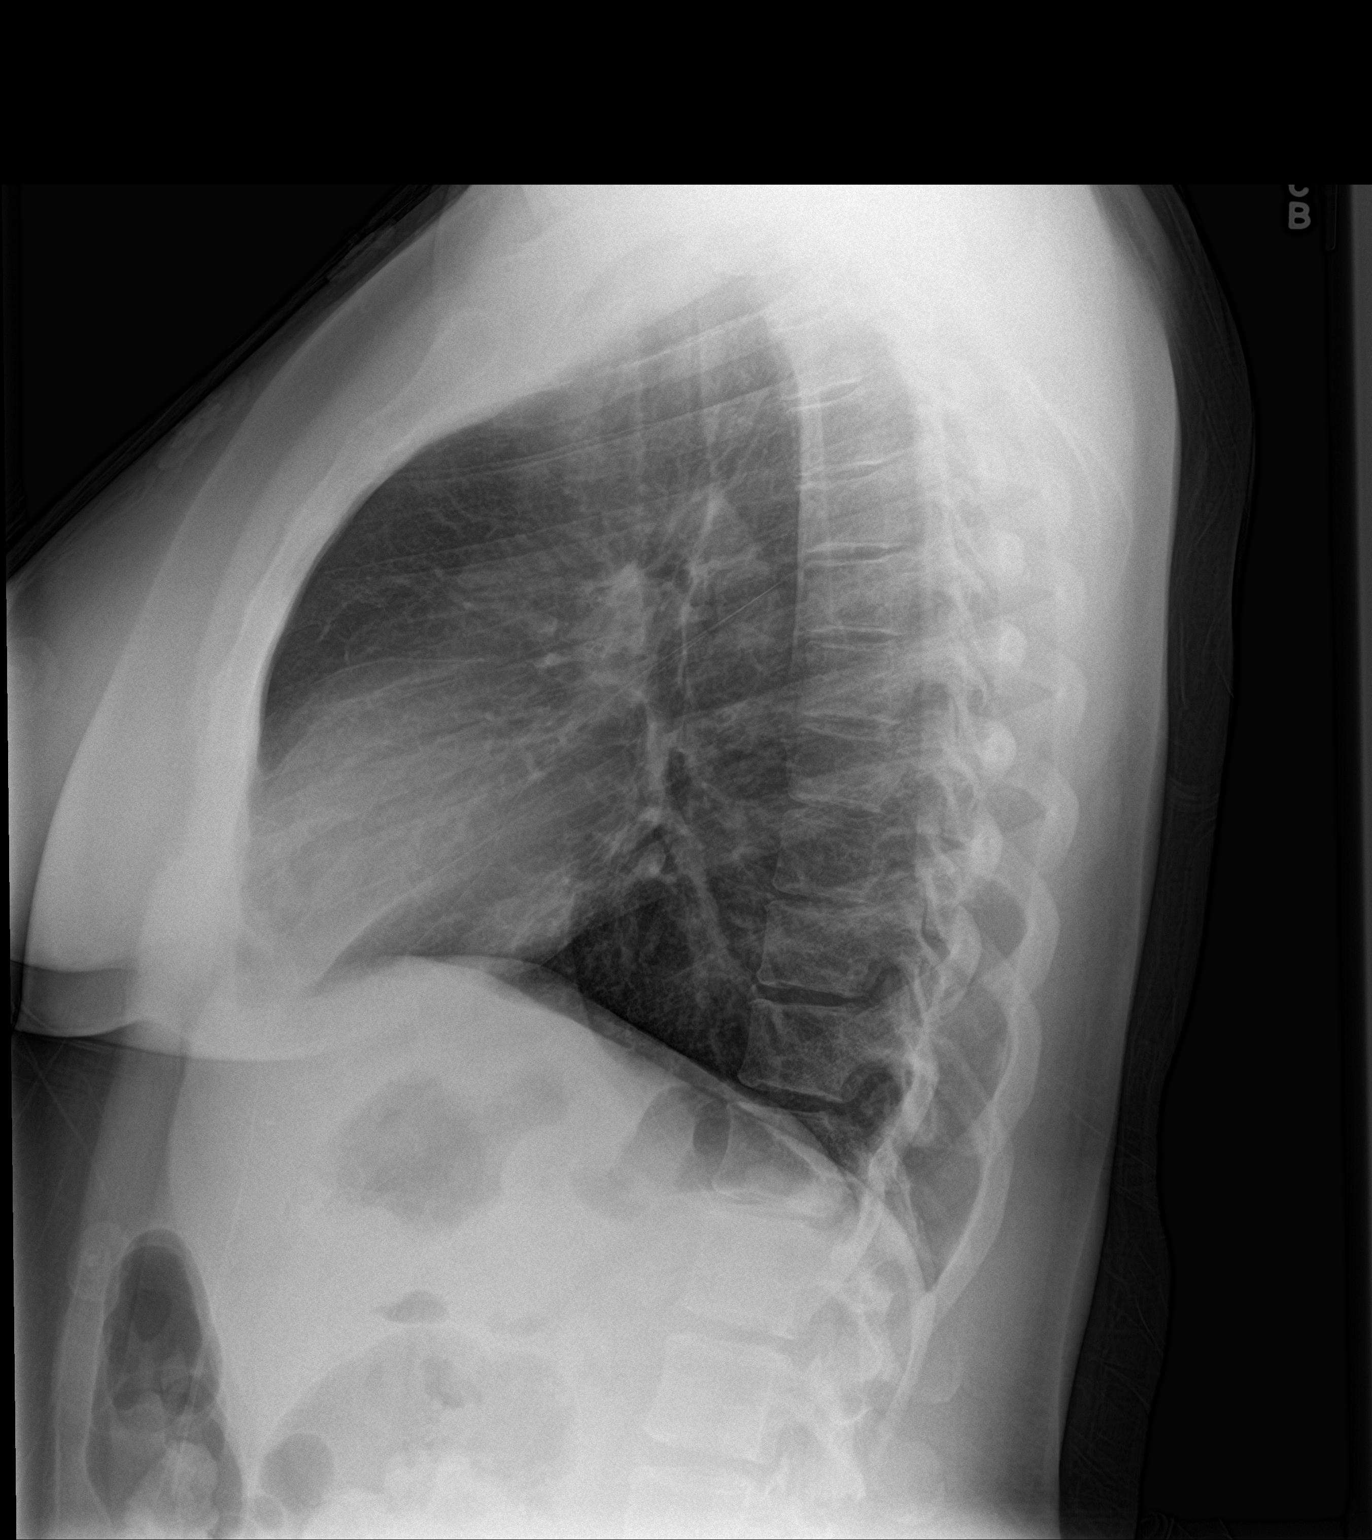

[2 of 2 positions shown; findings below may reference images not displayed]

FINDINGS: Near complete resolution of left lower lobe opacities. The lungs are
otherwise clear. No pleural effusion or pneumothorax. Normal
cardiomediastinal contours.
IMPRESSION: Near complete resolution of left lower lobe opacities. Otherwise
clear lungs.

## 2023-12-03 ENCOUNTER — Encounter: Payer: Self-pay | Admitting: Family Medicine
